# Patient Record
Sex: Male | Born: 1955 | Race: White | Hispanic: No | Marital: Married | Smoking: Never smoker
Health system: Southern US, Community
[De-identification: ages and names within clinical notes are randomized; demographics above are authoritative.]

## PROBLEM LIST (undated history)

## (undated) DIAGNOSIS — H409 Unspecified glaucoma: Secondary | ICD-10-CM

## (undated) DIAGNOSIS — I341 Nonrheumatic mitral (valve) prolapse: Secondary | ICD-10-CM

## (undated) DIAGNOSIS — Z8249 Family history of ischemic heart disease and other diseases of the circulatory system: Secondary | ICD-10-CM

## (undated) DIAGNOSIS — K589 Irritable bowel syndrome without diarrhea: Secondary | ICD-10-CM

## (undated) DIAGNOSIS — Z9289 Personal history of other medical treatment: Secondary | ICD-10-CM

## (undated) HISTORY — DX: Nonrheumatic mitral (valve) prolapse: I34.1

## (undated) HISTORY — DX: Irritable bowel syndrome without diarrhea: K58.9

## (undated) HISTORY — DX: Unspecified glaucoma: H40.9

## (undated) HISTORY — DX: Family history of ischemic heart disease and other diseases of the circulatory system: Z82.49

## (undated) HISTORY — DX: Personal history of other medical treatment: Z92.89

---

## 2007-10-18 DIAGNOSIS — Z9289 Personal history of other medical treatment: Secondary | ICD-10-CM

## 2007-10-18 HISTORY — DX: Personal history of other medical treatment: Z92.89

## 2008-09-12 HISTORY — PX: KNEE SURGERY: SHX244

## 2012-04-09 ENCOUNTER — Ambulatory Visit: Payer: BC Managed Care – PPO | Attending: Family Medicine

## 2012-04-09 DIAGNOSIS — M545 Low back pain, unspecified: Secondary | ICD-10-CM | POA: Insufficient documentation

## 2012-04-09 DIAGNOSIS — M25659 Stiffness of unspecified hip, not elsewhere classified: Secondary | ICD-10-CM | POA: Insufficient documentation

## 2012-04-09 DIAGNOSIS — IMO0001 Reserved for inherently not codable concepts without codable children: Secondary | ICD-10-CM | POA: Insufficient documentation

## 2012-04-09 DIAGNOSIS — M6281 Muscle weakness (generalized): Secondary | ICD-10-CM | POA: Insufficient documentation

## 2012-04-23 ENCOUNTER — Ambulatory Visit: Payer: BC Managed Care – PPO | Attending: Family Medicine

## 2012-04-23 DIAGNOSIS — IMO0001 Reserved for inherently not codable concepts without codable children: Secondary | ICD-10-CM | POA: Insufficient documentation

## 2012-04-23 DIAGNOSIS — M545 Low back pain, unspecified: Secondary | ICD-10-CM | POA: Insufficient documentation

## 2012-04-23 DIAGNOSIS — M6281 Muscle weakness (generalized): Secondary | ICD-10-CM | POA: Insufficient documentation

## 2012-05-06 ENCOUNTER — Ambulatory Visit: Payer: BC Managed Care – PPO | Admitting: Physical Therapy

## 2013-03-27 ENCOUNTER — Telehealth: Payer: Self-pay | Admitting: Internal Medicine

## 2013-03-27 NOTE — Telephone Encounter (Signed)
Returned call and pt verified x 2.  Pt informed message received.  Also informed it has been almost 2 years since last OV and MD can be notified, but may not advise as his condition and medication regimen may have changed since last seen.  Pt stated he has an appt w/ Dr. Rennis Golden on next Thursday and will wait until then.  Pt advised that would likely be the best plan. Pt verbalized understanding and agreed w/ plan.

## 2013-03-27 NOTE — Telephone Encounter (Signed)
Wants to know if he can take Celebrex? He has mitral valve prolapse.

## 2013-03-28 ENCOUNTER — Encounter: Payer: Self-pay | Admitting: *Deleted

## 2013-04-02 ENCOUNTER — Encounter: Payer: Self-pay | Admitting: Internal Medicine

## 2013-04-03 ENCOUNTER — Ambulatory Visit (INDEPENDENT_AMBULATORY_CARE_PROVIDER_SITE_OTHER): Payer: BC Managed Care – PPO | Admitting: Internal Medicine

## 2013-04-03 ENCOUNTER — Encounter: Payer: Self-pay | Admitting: Internal Medicine

## 2013-04-03 VITALS — BP 102/78 | HR 66 | Ht 68.0 in | Wt 142.9 lb

## 2013-04-03 DIAGNOSIS — Z8249 Family history of ischemic heart disease and other diseases of the circulatory system: Secondary | ICD-10-CM | POA: Insufficient documentation

## 2013-04-03 DIAGNOSIS — R002 Palpitations: Secondary | ICD-10-CM | POA: Insufficient documentation

## 2013-04-03 DIAGNOSIS — I341 Nonrheumatic mitral (valve) prolapse: Secondary | ICD-10-CM | POA: Insufficient documentation

## 2013-04-03 DIAGNOSIS — I059 Rheumatic mitral valve disease, unspecified: Secondary | ICD-10-CM

## 2013-04-03 NOTE — Progress Notes (Signed)
OFFICE NOTE  Chief Complaint:  Routine follow-up  Primary Care Physician: Bruce Patella, MD  HPI:  Bruce Curtis is a pleasant 57 yo male history of palpitations and mitral valve prolapse in the past.  He has a strong family history. His father had his first heart attack at age 5, as did several of his uncles, and his father died in his early 36s after another cardiac event. He gives a history in late September of 4 days in a row where he did excessively heavy activity, motocross, splitting wood, more motocross and then some heavy lifting, and the last day drank a 14-ounce either [facility] or Diet Dr. Reino Kent, but clearly it was full of caffeine. About 2 hours later he began noticing some ectopy. This ectopy was intermittent for about 6 hours. He could feel an occasional flutter sensation. It was predictable in that it occurred about every 2 or 3 minutes during this 6 hours. He finally went to bed, woke up the next morning and had about another 2 or 3 hours of the same thing but no other symptoms.  Since that time, he has had no recurrence of any arrhythmias. He has noticed some of these spells remotely in the past. He had been limiting his caffeine intake, and this may very well have been caffeine mediated. Overall he continues to get high-level exercise and is asymptomatic. I reviewed recent lab work which shows persistently low cholesterol less than 200, not on statin medication.  PMHx:  Past Medical History  Diagnosis Date  . Family history of mitral valve disorder     MVP in mother & brother    Past Surgical History  Procedure Laterality Date  . Hernia repair  2000  . Lasic eye surgery  2006    FAMHx:  Family History  Problem Relation Age of Onset  . Mitral valve prolapse Mother   . Coronary artery disease Father     pacemaker, stent  . Mitral valve prolapse Brother     SOCHx:   reports that he has never smoked. He has never used smokeless tobacco. He reports  that he drinks about 1.0 ounces of alcohol per week. He reports that he does not use illicit drugs.  ALLERGIES:  No Known Allergies  ROS: A comprehensive review of systems was negative except for: Cardiovascular: positive for rib pain  HOME MEDS: No current outpatient prescriptions on file.   No current facility-administered medications for this visit.    LABS/IMAGING: No results found for this or any previous visit (from the past 48 hour(s)). No results found.  VITALS: BP 110/68  Pulse 52  Ht 6\' 1"  (1.854 m)  Wt 153 lb 9.6 oz (69.673 kg)  BMI 20.27 kg/m2  EXAM: General appearance: alert and no distress Neck: no carotid bruit and no JVD Lungs: clear to auscultation bilaterally Heart: regular rate and rhythm, S1, S2 normal, slight 1/6 Systolic murmur at apex, no click, rub or gallop Abdomen: soft, non-tender; bowel sounds normal; no masses,  no organomegaly Extremities: extremities normal, atraumatic, no cyanosis or edema Pulses: 2+ and symmetric Skin: Skin color, texture, turgor normal. No rashes or lesions Neurologic: Grossly normal Psych: Mood, affect normal  EKG: Normal sinus rhythm at 66  ASSESSMENT: 1. History mitral valve prolapse 2. Palpitations-controlled  3. Family history of premature coronary disease  PLAN: 1.   [name] seems to be doing fairly well. He remains active he has had no problems with chest pain worsening shortness  of breath with exertion. His palpitations are well-controlled. I do not appreciate significant mitral prolapse on exam however there is a small amount of regurgitation. I counseled him that prophylaxis prior to dental work is unnecessary. I also encouraged him to continue to work on diet and exercise. He should contact me if he has any further concerns. Plan to see him back annually for risk factor modification.  Bruce Nose, MD, [facility] Attending Cardiologist [facility]  Curtis,Bruce C 04/03/2013, 8:44 AM

## 2013-04-03 NOTE — Patient Instructions (Signed)
Your physician wants you to follow-up in: 1 year with Dr. [name]. You will receive a reminder letter in the mail two months in advance. If you don't receive a letter, please call our office to schedule the follow-up appointment.  

## 2014-04-03 ENCOUNTER — Ambulatory Visit: Payer: BC Managed Care – PPO | Admitting: Internal Medicine

## 2014-04-08 ENCOUNTER — Ambulatory Visit: Payer: BC Managed Care – PPO | Admitting: Internal Medicine

## 2014-04-21 ENCOUNTER — Encounter: Payer: Self-pay | Admitting: Internal Medicine

## 2014-04-21 ENCOUNTER — Ambulatory Visit (INDEPENDENT_AMBULATORY_CARE_PROVIDER_SITE_OTHER): Payer: BC Managed Care – PPO | Admitting: Internal Medicine

## 2014-04-21 VITALS — BP 140/82 | HR 54 | Ht 68.0 in | Wt 138.5 lb

## 2014-04-21 DIAGNOSIS — Z8249 Family history of ischemic heart disease and other diseases of the circulatory system: Secondary | ICD-10-CM

## 2014-04-21 DIAGNOSIS — I341 Nonrheumatic mitral (valve) prolapse: Secondary | ICD-10-CM

## 2014-04-21 DIAGNOSIS — R002 Palpitations: Secondary | ICD-10-CM

## 2014-04-21 NOTE — Patient Instructions (Signed)
Your physician wants you to follow-up in: 1 year with Dr. [name]. You will receive a reminder letter in the mail two months in advance. If you don't receive a letter, please call our office to schedule the follow-up appointment.  

## 2014-04-21 NOTE — Progress Notes (Signed)
OFFICE NOTE  Chief Complaint:  Routine follow-up, no complaints  Primary Care Physician: Lolita PatellaEADE,Lovis ALEXANDER, MD  HPI:  Bruce Curtis is a pleasant 58 yo male history of palpitations and mitral valve prolapse in the past.  He has a strong family history. His father had his first heart attack at age 56, as did several of his uncles, and his father died in his early 6160s after another cardiac event. He gives a history in late September of 4 days in a row where he did excessively heavy activity, motocross, splitting wood, more motocross and then some heavy lifting, and the last day [facility] a 14[facility] or Diet Dr. Reino KentPepper, but clearly it was full of caffeine. About 2 hours later he began noticing some ectopy. This ectopy was intermittent for about 6 hours. He could feel an occasional flutter sensation. It was predictable in that it occurred about every 2 or 3 minutes during this 6 hours. He finally went to bed, woke up the next morning and had about another 2 or 3 hours of the same thing but no other symptoms.  Since that time, he has had no recurrence of any arrhythmias. He has noticed some of these spells remotely in the past. He had been limiting his caffeine intake, and this may very well have been caffeine mediated. Overall he continues to get high-level exercise and is asymptomatic. I reviewed recent lab work which shows persistently low cholesterol less than 200, not on statin medication.  Bruce Curtis returns a for follow-up. He continues to be extremely active. 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 He denies any chest pain or worsening shortness of breath. He's had no significant palpitations. He continues to avoid caffeine. He had a significant amount of weight loss with dietary changes and was actually close to being underweight but then actually put back on some weight. He is currently at a BMI 21 which is appropriate. Overall he is doing excellent.  Understand his cholesterol is been well controlled through his primary care provider and we will request copies of that data.   PMHx:  Past Medical History  Diagnosis Date  . Family history of mitral valve disorder     MVP in mother & brother    Past Surgical History  Procedure Laterality Date  . Hernia repair  2000  . Lasic eye surgery  2006    FAMHx:  Family History  Problem Relation Age of Onset  . Mitral valve prolapse Mother   . Coronary artery disease Father     pacemaker, stent  . Mitral valve prolapse Brother     SOCHx:   reports that he has never smoked. He has never used smokeless tobacco. He reports that he drinks about 1.0 ounces of alcohol per week. He reports that he does not use illicit drugs.  ALLERGIES:  No Known Allergies  ROS: A comprehensive review of systems was negative except for: Cardiovascular: positive for rib pain  HOME MEDS: No current outpatient prescriptions on file.   No current facility-administered medications for this visit.    LABS/IMAGING: No results found for this or any previous visit (from the past 48 hour(s)). No results found.  VITALS: BP 110/68  Pulse 52  Ht 6\' 1"  (1.854 m)  Wt 153 lb 9.6 oz (69.673 kg)  BMI 20.27 kg/m2  EXAM: General appearance: alert and no distress Neck: no carotid bruit and no JVD Lungs: clear to auscultation bilaterally  Heart: regular rate and rhythm, S1, S2 normal, slight 1/6 Systolic murmur at apex, no click, rub or gallop Abdomen: soft, non-tender; bowel sounds normal; no masses,  no organomegaly Extremities: extremities normal, atraumatic, no cyanosis or edema Pulses: 2+ and symmetric Skin: Skin color, texture, turgor normal. No rashes or lesions Neurologic: Grossly normal Psych: Mood, affect normal  EKG: Sinus bradycardia 54   ASSESSMENT: 1. History mitral valve prolapse 2. Palpitations-controlled  3. Family history of premature coronary disease  PLAN: 1.   Bruce Curtis is doing  extremely well. He is very active and has no problems with chest pain or shortness of breath. He is able to do high-level exertional activities of many metabolic equivalents. He has no significant palpitations. His cholesterol is followed by his primary care provider and reportedly well controlled. He continues on low-dose aspirin for primary prevention given his strong family history of coronary disease. He is not describing any anginal symptoms. Overall doing well plan to see him back in a year or sooner as necessary.   Bruce NoseKenneth C. Lindsie Simar, MD, [facility] Attending Cardiologist [facility]  Theodoro Koval C 04/03/2013, 8:44 AM

## 2014-07-29 ENCOUNTER — Other Ambulatory Visit (HOSPITAL_COMMUNITY): Payer: Self-pay | Admitting: Specialist

## 2014-07-29 ENCOUNTER — Ambulatory Visit (HOSPITAL_COMMUNITY)
Admission: RE | Admit: 2014-07-29 | Discharge: 2014-07-29 | Disposition: A | Discharge status: Home / Self Care | Payer: BC Managed Care – PPO | Source: Ambulatory Visit | Attending: Specialist | Admitting: Specialist

## 2014-07-29 DIAGNOSIS — T1590XA Foreign body on external eye, part unspecified, unspecified eye, initial encounter: Secondary | ICD-10-CM | POA: Diagnosis present

## 2014-07-29 DIAGNOSIS — X58XXXA Exposure to other specified factors, initial encounter: Secondary | ICD-10-CM | POA: Insufficient documentation

## 2014-07-29 DIAGNOSIS — S8992XA Unspecified injury of left lower leg, initial encounter: Secondary | ICD-10-CM

## 2015-03-05 ENCOUNTER — Telehealth: Payer: Self-pay | Admitting: Internal Medicine

## 2015-03-05 NOTE — Telephone Encounter (Signed)
Noted  

## 2015-03-05 NOTE — Telephone Encounter (Signed)
[place]

## 2016-01-08 IMAGING — CR DG ORBITS FOR FOREIGN BODY
2 series · 2 of 2 positions shown · non-contrast
Comparison: None.

CLINICAL DATA: Metal working/exposure; clearance prior to MRI

EXAM:
ORBITS FOR FOREIGN BODY - 2 VIEW

[w waters (1 of 2)]
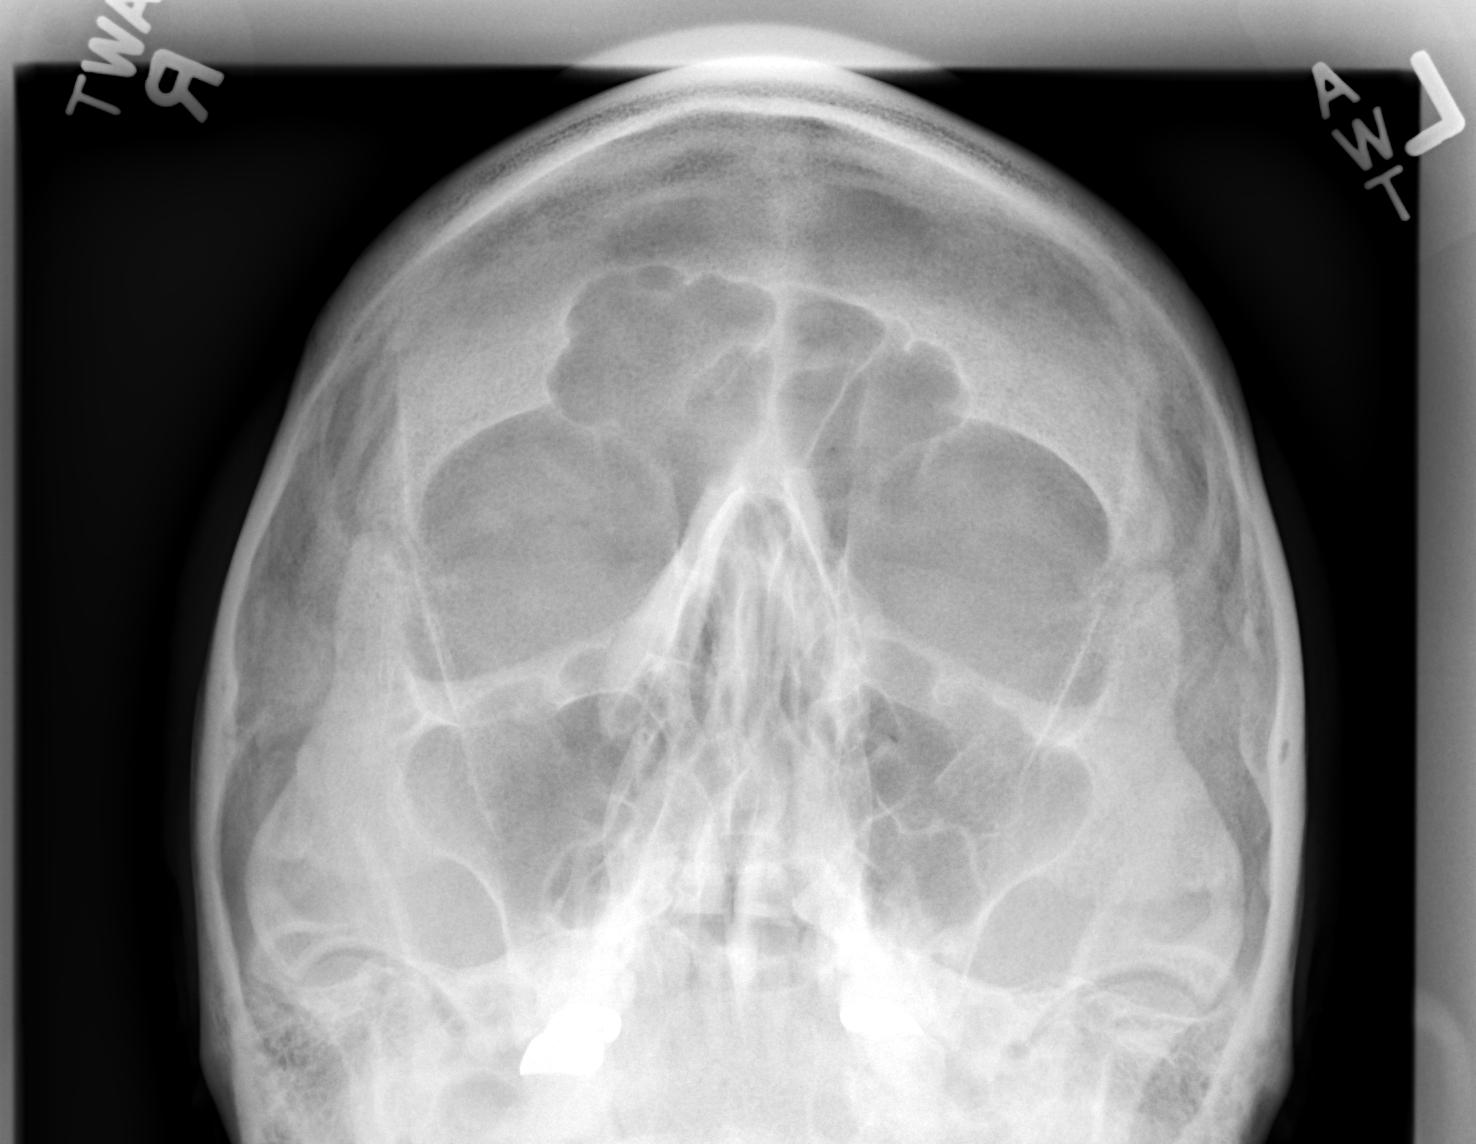

[w waters (2 of 2)]
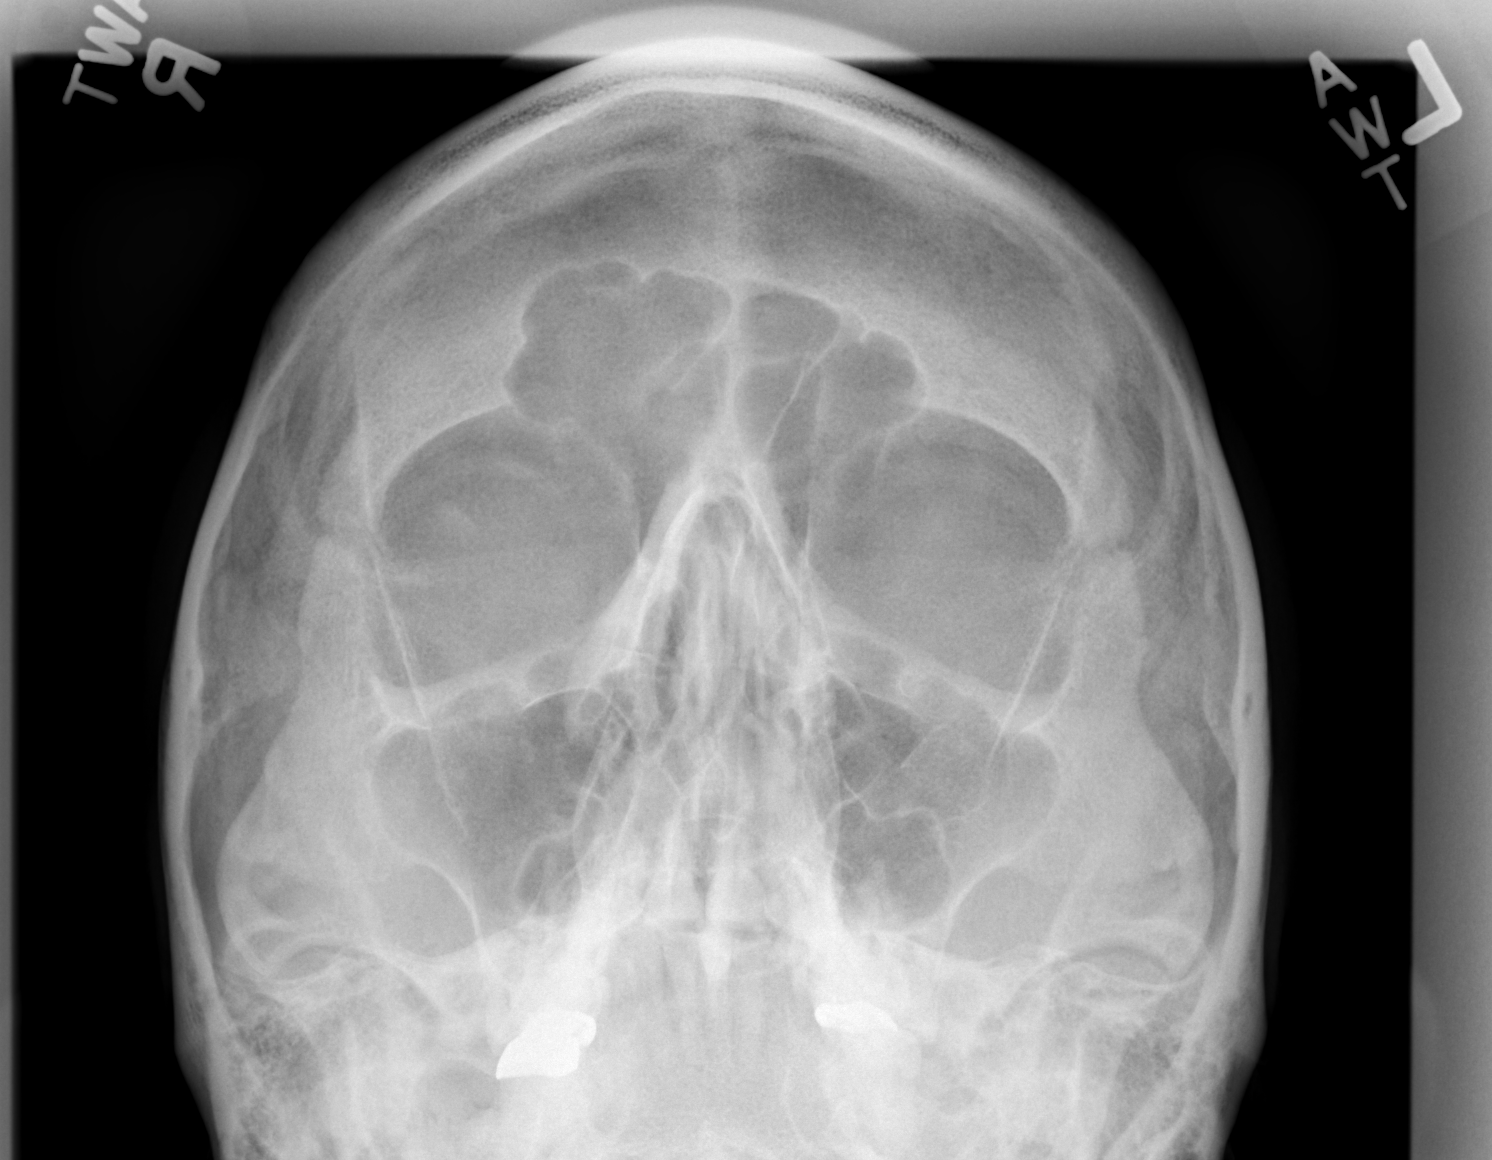

[2 of 2 positions shown; findings below may reference images not displayed]

FINDINGS: There is no evidence of metallic foreign body within the orbits. No
significant bone abnormality identified. The observed paranasal
sinuses are clear.
IMPRESSION: No evidence of metallic foreign body within the orbits.
